# Patient Record
Sex: Female | Born: 1955 | Race: White | Hispanic: No | Marital: Married | Smoking: Never smoker
Health system: Southern US, Community
[De-identification: ages and names within clinical notes are randomized; demographics above are authoritative.]

## PROBLEM LIST (undated history)

## (undated) DIAGNOSIS — Z853 Personal history of malignant neoplasm of breast: Secondary | ICD-10-CM

## (undated) DIAGNOSIS — T8859XA Other complications of anesthesia, initial encounter: Secondary | ICD-10-CM

## (undated) DIAGNOSIS — N950 Postmenopausal bleeding: Secondary | ICD-10-CM

## (undated) DIAGNOSIS — T4145XA Adverse effect of unspecified anesthetic, initial encounter: Secondary | ICD-10-CM

## (undated) DIAGNOSIS — Z9889 Other specified postprocedural states: Secondary | ICD-10-CM

## (undated) DIAGNOSIS — Z973 Presence of spectacles and contact lenses: Secondary | ICD-10-CM

## (undated) DIAGNOSIS — R112 Nausea with vomiting, unspecified: Secondary | ICD-10-CM

## (undated) DIAGNOSIS — Z923 Personal history of irradiation: Secondary | ICD-10-CM

## (undated) HISTORY — PX: HYSTEROSCOPY WITH D & C: SHX1775

---

## 2002-05-05 ENCOUNTER — Other Ambulatory Visit: Admission: RE | Admit: 2002-05-05 | Discharge: 2002-05-05 | Payer: Self-pay | Admitting: Gynecology

## 2003-06-19 ENCOUNTER — Other Ambulatory Visit: Admission: RE | Admit: 2003-06-19 | Discharge: 2003-06-19 | Payer: Self-pay | Admitting: Gynecology

## 2008-09-25 ENCOUNTER — Encounter: Admission: RE | Admit: 2008-09-25 | Discharge: 2008-09-25 | Payer: Self-pay | Admitting: Gynecology

## 2008-09-25 ENCOUNTER — Encounter (INDEPENDENT_AMBULATORY_CARE_PROVIDER_SITE_OTHER): Payer: Self-pay | Admitting: Gynecology

## 2008-10-10 HISTORY — PX: BREAST LUMPECTOMY W/ NEEDLE LOCALIZATION: SHX1266

## 2008-11-01 HISTORY — PX: RE-EXCISION OF BREAST LUMPECTOMY: SHX6048

## 2010-10-01 ENCOUNTER — Other Ambulatory Visit: Payer: Self-pay | Admitting: Gynecology

## 2011-10-20 ENCOUNTER — Other Ambulatory Visit: Payer: Self-pay | Admitting: Gynecology

## 2012-05-19 ENCOUNTER — Other Ambulatory Visit: Payer: Self-pay | Admitting: Gynecology

## 2012-06-30 ENCOUNTER — Other Ambulatory Visit: Payer: Self-pay | Admitting: Obstetrics and Gynecology

## 2013-02-16 ENCOUNTER — Ambulatory Visit (HOSPITAL_COMMUNITY): Admission: RE | Admit: 2013-02-16 | Payer: Self-pay | Source: Ambulatory Visit | Admitting: Obstetrics and Gynecology

## 2013-02-16 ENCOUNTER — Encounter (HOSPITAL_COMMUNITY): Admission: RE | Payer: Self-pay | Source: Ambulatory Visit

## 2013-02-16 SURGERY — DILATATION & CURETTAGE/HYSTEROSCOPY WITH TRUCLEAR
Anesthesia: Choice

## 2013-04-15 ENCOUNTER — Encounter (HOSPITAL_COMMUNITY): Payer: Self-pay | Admitting: *Deleted

## 2013-04-19 ENCOUNTER — Encounter (HOSPITAL_COMMUNITY): Payer: Self-pay | Admitting: Pharmacist

## 2013-04-28 NOTE — H&P (Addendum)
58 yo with endometrial polyps presents for surgical mngt  PMHx:  Breast ca PSHx:  Hysteroscopy/D&C, lumpectomy, SVD x 2 SHX:  Negative tobacco All:  Neg Meds:  Tamoxifen  AF, VSS gen - NAD Abd - soft, Nt CV - RRR Lungs - clear PV - uterus mobile NT, no adnexal mass  US - 12 & 14mm polypoid mass.  No adnexal mass  A/P:  Endometrial mass, polyps Hysteroscopy D&C with trueclear resection of endometrial mass R/b/a reviewed, questions answered, informed consent

## 2013-04-29 ENCOUNTER — Encounter (HOSPITAL_COMMUNITY): Payer: BC Managed Care – PPO | Admitting: Anesthesiology

## 2013-04-29 ENCOUNTER — Ambulatory Visit (HOSPITAL_COMMUNITY): Payer: BC Managed Care – PPO | Admitting: Anesthesiology

## 2013-04-29 ENCOUNTER — Encounter (HOSPITAL_COMMUNITY)
Admission: RE | Disposition: A | Discharge status: Home / Self Care | Payer: Self-pay | Source: Ambulatory Visit | Attending: Obstetrics and Gynecology

## 2013-04-29 ENCOUNTER — Encounter (HOSPITAL_COMMUNITY): Payer: Self-pay | Admitting: *Deleted

## 2013-04-29 ENCOUNTER — Ambulatory Visit (HOSPITAL_COMMUNITY)
Admission: RE | Admit: 2013-04-29 | Discharge: 2013-04-29 | Disposition: A | Discharge status: Home / Self Care | Payer: BC Managed Care – PPO | Source: Ambulatory Visit | Attending: Obstetrics and Gynecology | Admitting: Obstetrics and Gynecology

## 2013-04-29 DIAGNOSIS — Z853 Personal history of malignant neoplasm of breast: Secondary | ICD-10-CM | POA: Insufficient documentation

## 2013-04-29 DIAGNOSIS — N840 Polyp of corpus uteri: Secondary | ICD-10-CM | POA: Insufficient documentation

## 2013-04-29 HISTORY — PX: DILATATION & CURETTAGE/HYSTEROSCOPY WITH TRUECLEAR: SHX6353

## 2013-04-29 LAB — CBC
HCT: 38.4 % (ref 36.0–46.0)
Hemoglobin: 13.3 g/dL (ref 12.0–15.0)
MCH: 31 pg (ref 26.0–34.0)
MCHC: 34.6 g/dL (ref 30.0–36.0)
MCV: 89.5 fL (ref 78.0–100.0)
PLATELETS: 195 10*3/uL (ref 150–400)
RBC: 4.29 MIL/uL (ref 3.87–5.11)
RDW: 13.1 % (ref 11.5–15.5)
WBC: 5.7 10*3/uL (ref 4.0–10.5)

## 2013-04-29 SURGERY — DILATATION & CURETTAGE/HYSTEROSCOPY WITH TRUCLEAR
Anesthesia: General | Site: Uterus

## 2013-04-29 MED ORDER — PROPOFOL 10 MG/ML IV EMUL
INTRAVENOUS | Status: AC
  Filled 2013-04-29: qty 20

## 2013-04-29 MED ORDER — FENTANYL CITRATE 0.05 MG/ML IJ SOLN: 25.0000 ug | INTRAMUSCULAR | Status: DC | PRN

## 2013-04-29 MED ORDER — LACTATED RINGERS IV SOLN
INTRAVENOUS | Status: DC
  Administered 2013-04-29 (×2): via INTRAVENOUS

## 2013-04-29 MED ORDER — ONDANSETRON HCL 4 MG/2ML IJ SOLN: 4.0000 mg | Freq: Once | INTRAMUSCULAR | Status: DC | PRN

## 2013-04-29 MED ORDER — ONDANSETRON HCL 4 MG/2ML IJ SOLN
INTRAMUSCULAR | Status: AC
  Filled 2013-04-29: qty 2

## 2013-04-29 MED ORDER — FENTANYL CITRATE 0.05 MG/ML IJ SOLN
INTRAMUSCULAR | Status: DC | PRN
  Administered 2013-04-29: 100 ug via INTRAVENOUS

## 2013-04-29 MED ORDER — ACETAMINOPHEN 160 MG/5ML PO SOLN: 325.0000 mg | ORAL | Status: DC | PRN

## 2013-04-29 MED ORDER — PROPOFOL 10 MG/ML IV BOLUS
INTRAVENOUS | Status: DC | PRN
  Administered 2013-04-29: 180 mg via INTRAVENOUS

## 2013-04-29 MED ORDER — ACETAMINOPHEN 325 MG PO TABS: 325.0000 mg | ORAL_TABLET | ORAL | Status: DC | PRN

## 2013-04-29 MED ORDER — IBUPROFEN 600 MG PO TABS: 600.0000 mg | ORAL_TABLET | Freq: Four times a day (QID) | ORAL | Status: DC | PRN

## 2013-04-29 MED ORDER — LIDOCAINE 1%/NA BICARB 0.1 MEQ INJECTION
INJECTION | INTRAVENOUS | Status: AC
  Filled 2013-04-29: qty 3

## 2013-04-29 MED ORDER — FENTANYL CITRATE 0.05 MG/ML IJ SOLN
INTRAMUSCULAR | Status: AC
  Filled 2013-04-29: qty 5

## 2013-04-29 MED ORDER — OXYCODONE-ACETAMINOPHEN 5-325 MG PO TABS: 1.0000 | ORAL_TABLET | ORAL | Status: DC | PRN

## 2013-04-29 MED ORDER — DEXAMETHASONE SODIUM PHOSPHATE 10 MG/ML IJ SOLN
INTRAMUSCULAR | Status: AC
  Filled 2013-04-29: qty 1

## 2013-04-29 MED ORDER — MIDAZOLAM HCL 2 MG/2ML IJ SOLN
INTRAMUSCULAR | Status: DC | PRN
  Administered 2013-04-29: 2 mg via INTRAVENOUS

## 2013-04-29 MED ORDER — LIDOCAINE HCL 1 % IJ SOLN
INTRAMUSCULAR | Status: DC | PRN
  Administered 2013-04-29: 10 mL

## 2013-04-29 MED ORDER — DEXAMETHASONE SODIUM PHOSPHATE 10 MG/ML IJ SOLN
INTRAMUSCULAR | Status: DC | PRN
  Administered 2013-04-29: 10 mg via INTRAVENOUS

## 2013-04-29 MED ORDER — EPHEDRINE 5 MG/ML INJ
INTRAVENOUS | Status: AC
  Filled 2013-04-29: qty 10

## 2013-04-29 MED ORDER — KETOROLAC TROMETHAMINE 30 MG/ML IJ SOLN
INTRAMUSCULAR | Status: DC | PRN
  Administered 2013-04-29: 30 mg via INTRAVENOUS

## 2013-04-29 MED ORDER — KETOROLAC TROMETHAMINE 30 MG/ML IJ SOLN: 15.0000 mg | Freq: Once | INTRAMUSCULAR | Status: DC | PRN

## 2013-04-29 MED ORDER — LIDOCAINE HCL 1 % IJ SOLN
INTRAMUSCULAR | Status: AC
  Filled 2013-04-29: qty 20

## 2013-04-29 MED ORDER — LIDOCAINE HCL (CARDIAC) 20 MG/ML IV SOLN
INTRAVENOUS | Status: DC | PRN
  Administered 2013-04-29: 50 mg via INTRAVENOUS

## 2013-04-29 MED ORDER — KETOROLAC TROMETHAMINE 30 MG/ML IJ SOLN
INTRAMUSCULAR | Status: AC
  Filled 2013-04-29: qty 1

## 2013-04-29 MED ORDER — EPHEDRINE SULFATE 50 MG/ML IJ SOLN
INTRAMUSCULAR | Status: DC | PRN
  Administered 2013-04-29 (×2): 10 mg via INTRAVENOUS

## 2013-04-29 MED ORDER — DEXTROSE 5 % IV SOLN
2.0000 g | INTRAVENOUS | Status: AC
  Administered 2013-04-29: 2 g via INTRAVENOUS
  Filled 2013-04-29: qty 2

## 2013-04-29 MED ORDER — LIDOCAINE HCL (CARDIAC) 20 MG/ML IV SOLN
INTRAVENOUS | Status: AC
  Filled 2013-04-29: qty 5

## 2013-04-29 MED ORDER — SODIUM CHLORIDE 0.9 % IR SOLN
Status: DC | PRN
  Administered 2013-04-29: 3000 mL

## 2013-04-29 MED ORDER — MIDAZOLAM HCL 2 MG/2ML IJ SOLN
INTRAMUSCULAR | Status: AC
  Filled 2013-04-29: qty 2

## 2013-04-29 MED ORDER — ONDANSETRON HCL 4 MG/2ML IJ SOLN
INTRAMUSCULAR | Status: DC | PRN
  Administered 2013-04-29: 4 mg via INTRAVENOUS

## 2013-04-29 SURGICAL SUPPLY — 19 items
BLADE INCISOR TRUC PLUS 2.9 (ABLATOR) ×1 IMPLANT
CANISTERS HI-FLOW 3000CC (CANNISTER) ×8 IMPLANT
CATH ROBINSON RED A/P 16FR (CATHETERS) ×2 IMPLANT
CLOTH BEACON ORANGE TIMEOUT ST (SAFETY) ×2 IMPLANT
DRAPE HYSTEROSCOPY (DRAPE) ×2 IMPLANT
DRSG TELFA 3X8 NADH (GAUZE/BANDAGES/DRESSINGS) ×2 IMPLANT
GLOVE BIO SURGEON STRL SZ 6.5 (GLOVE) ×2 IMPLANT
GLOVE BIOGEL PI IND STRL 7.0 (GLOVE) ×6 IMPLANT
GLOVE BIOGEL PI INDICATOR 7.0 (GLOVE) ×6
GLOVE SURG SS PI 7.0 STRL IVOR (GLOVE) ×8 IMPLANT
GOWN STRL REUS W/TWL LRG LVL3 (GOWN DISPOSABLE) ×4 IMPLANT
INCISOR TRUC PLUS BLADE 2.9 (ABLATOR) ×2
KIT HYSTEROSCOPY TRUCLEAR (ABLATOR) ×2 IMPLANT
NEEDLE SPNL 22GX3.5 QUINCKE BK (NEEDLE) ×2 IMPLANT
PACK VAGINAL MINOR WOMEN LF (CUSTOM PROCEDURE TRAY) ×2 IMPLANT
PAD OB MATERNITY 4.3X12.25 (PERSONAL CARE ITEMS) ×2 IMPLANT
SYR CONTROL 10ML LL (SYRINGE) ×2 IMPLANT
TOWEL OR 17X24 6PK STRL BLUE (TOWEL DISPOSABLE) ×4 IMPLANT
WATER STERILE IRR 1000ML POUR (IV SOLUTION) ×2 IMPLANT

## 2013-04-29 NOTE — Discharge Instructions (Signed)
[facility] office 2-3 weeks for postop appointment.  Call the office [phone] for an appointment. ° °Personal Hygiene: °Use pads not tampons x 1week °You may shower, no tub baths or pools for 2-3 weeks °Wipe from front to back when using restroom ° °Activity: °Do not drive or operate any equipment for 24 hrs.   °Do not rest in bed all day °Walking is encouraged °Walk up and down stairs slowly °You may return to your normal activity in 1-2 days ° °Sexual Activity:  No intercourse for 2 weeks after the procedure. ° °Diet: Eat a light meal as desired this evening.  You may resume your usual diet tomorrow. ° °Return to work:  You may resume your work activities after 1-2 days ° °What to expect:  Expect to have vaginal bleeding/discharge for 2-3 days and spotting for 10-14 days.  It is not unusual to have soreness for 1-2 weeks.  You may have a slight burning sensation when you urinate for the first few days.  You may start your menses in 2-6 weeks.  Mild cramps may continue for a couple of days.   ° °Call your doctor:   °Excessive bleeding, saturating a pad every hour °Inability to urinate 6 hours after discharge °Pain not relieved with pain medications °Fever of 100.4 or greater ° °

## 2013-04-29 NOTE — Anesthesia Preprocedure Evaluation (Signed)
Anesthesia Evaluation  Patient identified by MRN, date of birth, ID band Patient awake    Reviewed: Allergy & Precautions, H&P , NPO status , Patient's Chart, lab work & pertinent test results  History of Anesthesia Complications Negative for: history of anesthetic complications  Airway Mallampati: II TM Distance: >3 FB Neck ROM: Full    Dental  (+) Teeth Intact   Pulmonary neg pulmonary ROS,    Pulmonary exam normal       Cardiovascular negative cardio ROS  Rhythm:Regular Rate:Normal     Neuro/Psych negative neurological ROS     GI/Hepatic negative GI ROS, Neg liver ROS,   Endo/Other  negative endocrine ROS  Renal/GU negative Renal ROS  negative genitourinary   Musculoskeletal negative musculoskeletal ROS (+)   Abdominal   Peds  Hematology negative hematology ROS (+)   Anesthesia Other Findings   Reproductive/Obstetrics                           Anesthesia Physical Anesthesia Plan  ASA: II  Anesthesia Plan: General   Post-op Pain Management:    Induction: Intravenous  Airway Management Planned: LMA  Additional Equipment: None  Intra-op Plan:   Post-operative Plan: Extubation in OR  Informed Consent: I have reviewed the patients History and Physical, chart, labs and discussed the procedure including the risks, benefits and alternatives for the proposed anesthesia with the patient or authorized representative who has indicated his/her understanding and acceptance.   Dental advisory given  Plan Discussed with: CRNA and Surgeon  Anesthesia Plan Comments:         Anesthesia Quick Evaluation

## 2013-04-29 NOTE — Transfer of Care (Signed)
Immediate Anesthesia Transfer of Care Note  Patient: Barbara GibbonsDonna A [name]  Procedure(s) Performed: Procedure(s): DILATATION & CURETTAGE/HYSTEROSCOPY WITH TRUCLEAR (N/A)  Patient Location: PACU  Anesthesia Type:General  Level of Consciousness: awake  Airway & Oxygen Therapy: Patient Spontanous Breathing  Post-op Assessment: Report given to PACU RN  Post vital signs: stable  Filed Vitals:   04/29/13 1256  BP: 121/58  Pulse: 74  Temp: 36.8 C  Resp: 18    Complications: No apparent anesthesia complications

## 2013-04-29 NOTE — Anesthesia Postprocedure Evaluation (Signed)
  Anesthesia Post-op Note  Patient: Barbara GibbonsDonna A [name]  Procedure(s) Performed: Procedure(s): DILATATION & CURETTAGE/HYSTEROSCOPY WITH TRUCLEAR (N/A)  Patient Location: PACU  Anesthesia Type:General  Level of Consciousness: awake, alert  and oriented  Airway and Oxygen Therapy: Patient Spontanous Breathing  Post-op Pain: mild  Post-op Assessment: Post-op Vital signs reviewed, Patient's Cardiovascular Status Stable, Respiratory Function Stable, Patent Airway, No signs of Nausea or vomiting and Pain level controlled  Post-op Vital Signs: Reviewed and stable  Complications: No apparent anesthesia complications

## 2013-04-29 NOTE — Op Note (Signed)
Barbara Mo:  [name], Juda                 ACCOUNT NO.:  [id number]  MEDICAL RECORD NO.:  [id number]  LOCATION:  [facility]                          FACILITY:  [facility]  PHYSICIAN:  Zelphia CairoGretchen Weiland Tomich, MD    DATE OF BIRTH:  Nov 25, 1955  DATE OF PROCEDURE: DATE OF DISCHARGE:  04/29/2013                              OPERATIVE REPORT   PREOPERATIVE DIAGNOSIS:  Endometrial polyps.  POSTOPERATIVE DIAGNOSIS:  Endometrial polyps, path pending.  PROCEDURE: 1. Cervical block. 2. Hysteroscopy. 3. D and C. 4. True clear resection of endometrial polyp.  SURGEON:  Zelphia CairoGretchen Anijah Spohr, MD  ANESTHESIA:  General.  FLUID DEFICIT:  100 mL.  BLOOD LOSS:  Minimal.  COMPLICATIONS:  None.  CONDITION:  Stable to recovery room.  PROCEDURE:  The patient was taken to the operating room.  After informed consent was obtained, she was given general anesthesia and placed in the dorsal lithotomy position using Allen stirrups.  She was prepped and draped in sterile fashion and in and out catheter was used to drain her bladder for an unmeasured amount of urine bivalve.  Speculum was placed in the vagina and 1 mL of 1% Xylocaine was injected at the 12 o'clock position of the cervix.  Single-tooth tenaculum was attached to the anterior lip of the cervix.  The remaining 9 mL was used to perform a cervical block.  The cervix was then serially dilated using Pratt dilators.  Hysteroscope was inserted into the cavity and multiple polypoid masses were identified.  True clear device was used to resect the polypoid masses to the base.  Specimens were passed off to be sent to pathology.  No other masses abnormalities were identified, bilateral ostia were visualized and appeared normal.  Instruments were removed from the uterus.  The tenaculum was removed from the cervix.  The cervix was hemostatic.  Speculum was removed.  She was extubated and taken to the recovery room in stable condition.  Sponge, lap, needle, and instrument counts  were correct x2.     Zelphia CairoGretchen Frimet Durfee, MD     GA/MEDQ  D:  04/29/2013  T:  04/29/2013  Job:  [id number]

## 2013-05-02 ENCOUNTER — Encounter (HOSPITAL_COMMUNITY): Payer: Self-pay | Admitting: Obstetrics and Gynecology

## 2014-01-11 ENCOUNTER — Other Ambulatory Visit: Payer: Self-pay | Admitting: Gynecology

## 2014-01-13 LAB — CYTOLOGY - PAP

## 2014-01-17 ENCOUNTER — Other Ambulatory Visit: Payer: Self-pay | Admitting: Gynecology

## 2014-05-11 ENCOUNTER — Encounter (HOSPITAL_BASED_OUTPATIENT_CLINIC_OR_DEPARTMENT_OTHER): Payer: Self-pay | Admitting: *Deleted

## 2014-05-12 ENCOUNTER — Encounter (HOSPITAL_BASED_OUTPATIENT_CLINIC_OR_DEPARTMENT_OTHER): Payer: Self-pay | Admitting: *Deleted

## 2014-05-12 NOTE — Progress Notes (Signed)
NPO AFTER MN. ARRIVE AT 0600.  NEEDS HG. PRE-OP PENDING.

## 2014-05-15 NOTE — H&P (Addendum)
Barbara GibbonsDonna A [name] is an 59 y.o. female with recurrent endometrial polyps presents for surgical resection.  Pt with h/o breast cancer on tamoxifen.      No LMP recorded. Patient is postmenopausal.    Past Medical History  Diagnosis Date  . Endometrial polyp   . 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 History of radiation therapy     10/ 2010--  02-21-2009--  right breast  . Complication of anesthesia     hard to wake  . PONV (postoperative nausea and vomiting)   . Wears contact lenses     Past Surgical History  Procedure Laterality Date  . Dilatation & curettage/hysteroscopy with trueclear N/A 04/29/2013    Procedure: DILATATION & CURETTAGE/HYSTEROSCOPY WITH TRUCLEAR;  Surgeon: Zelphia CairoGretchen Helio Lack, MD;  Location: [facility];  Service: Gynecology;  Laterality: N/A;  . Hysteroscopy w/d&c  aug 2012  &  april 2014  . Breast lumpectomy w/ needle localization Right 10-10-2008  . Re-excision of breast lumpectomy  sept 2010    History reviewed. No pertinent family history.  Social History:  reports that she has never smoked. She has never used smokeless tobacco. She reports that she drinks alcohol. She reports that she does not use illicit drugs.  Allergies:  Allergies  Allergen Reactions  . 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 Physical Exam  Gen - NAD Abd - soft, NT/ND CV - RRR Lungs - clear Ext - NT, no edema PV - uterus mobile, NT  US - 2.693mm & 9mm endometrial mass noted,  No adnexal masses   Assessment/Plan:  Endometrial polyps Hysteroscopy, D&C, resection of endometrial polyps R/b/a discussed, questions answered, informed  consent  Isaiah Torok 05/15/2014, 1:32 PM

## 2014-05-16 ENCOUNTER — Ambulatory Visit (HOSPITAL_BASED_OUTPATIENT_CLINIC_OR_DEPARTMENT_OTHER): Payer: BC Managed Care – PPO | Admitting: Anesthesiology

## 2014-05-16 ENCOUNTER — Encounter (HOSPITAL_BASED_OUTPATIENT_CLINIC_OR_DEPARTMENT_OTHER): Payer: Self-pay

## 2014-05-16 ENCOUNTER — Encounter (HOSPITAL_BASED_OUTPATIENT_CLINIC_OR_DEPARTMENT_OTHER)
Admission: RE | Disposition: A | Discharge status: Home / Self Care | Payer: Self-pay | Source: Ambulatory Visit | Attending: Obstetrics and Gynecology

## 2014-05-16 ENCOUNTER — Ambulatory Visit (HOSPITAL_BASED_OUTPATIENT_CLINIC_OR_DEPARTMENT_OTHER)
Admission: RE | Admit: 2014-05-16 | Discharge: 2014-05-16 | Disposition: A | Discharge status: Home / Self Care | Payer: BC Managed Care – PPO | Source: Ambulatory Visit | Attending: Obstetrics and Gynecology | Admitting: Obstetrics and Gynecology

## 2014-05-16 DIAGNOSIS — N840 Polyp of corpus uteri: Secondary | ICD-10-CM | POA: Diagnosis not present

## 2014-05-16 DIAGNOSIS — Z853 Personal history of malignant neoplasm of breast: Secondary | ICD-10-CM | POA: Diagnosis not present

## 2014-05-16 DIAGNOSIS — Z888 Allergy status to other drugs, medicaments and biological substances status: Secondary | ICD-10-CM | POA: Insufficient documentation

## 2014-05-16 DIAGNOSIS — N856 Intrauterine synechiae: Secondary | ICD-10-CM | POA: Insufficient documentation

## 2014-05-16 DIAGNOSIS — Z9011 Acquired absence of right breast and nipple: Secondary | ICD-10-CM | POA: Diagnosis not present

## 2014-05-16 HISTORY — DX: Other specified postprocedural states: R11.2

## 2014-05-16 HISTORY — PX: DILATATION & CURETTAGE/HYSTEROSCOPY WITH MYOSURE: SHX6511

## 2014-05-16 HISTORY — DX: Personal history of malignant neoplasm of breast: Z85.3

## 2014-05-16 HISTORY — DX: Personal history of irradiation: Z92.3

## 2014-05-16 HISTORY — DX: Other complications of anesthesia, initial encounter: T88.59XA

## 2014-05-16 HISTORY — DX: Other specified postprocedural states: Z98.890

## 2014-05-16 HISTORY — DX: Presence of spectacles and contact lenses: Z97.3

## 2014-05-16 HISTORY — DX: Adverse effect of unspecified anesthetic, initial encounter: T41.45XA

## 2014-05-16 LAB — CBC
HCT: 38.4 % (ref 36.0–46.0)
HEMOGLOBIN: 13 g/dL (ref 12.0–15.0)
MCH: 30.8 pg (ref 26.0–34.0)
MCHC: 33.9 g/dL (ref 30.0–36.0)
MCV: 91 fL (ref 78.0–100.0)
Platelets: 208 10*3/uL (ref 150–400)
RBC: 4.22 MIL/uL (ref 3.87–5.11)
RDW: 13.4 % (ref 11.5–15.5)
WBC: 6 10*3/uL (ref 4.0–10.5)

## 2014-05-16 SURGERY — DILATATION & CURETTAGE/HYSTEROSCOPY WITH MYOSURE
Anesthesia: General | Site: Vagina

## 2014-05-16 MED ORDER — ONDANSETRON HCL 4 MG/2ML IJ SOLN
INTRAMUSCULAR | Status: DC | PRN
  Administered 2014-05-16: 4 mg via INTRAVENOUS

## 2014-05-16 MED ORDER — ACETAMINOPHEN 10 MG/ML IV SOLN
INTRAVENOUS | Status: DC | PRN
  Administered 2014-05-16: 1000 mg via INTRAVENOUS

## 2014-05-16 MED ORDER — LACTATED RINGERS IV SOLN
INTRAVENOUS | Status: DC
  Administered 2014-05-16: 07:00:00 via INTRAVENOUS
  Filled 2014-05-16: qty 1000

## 2014-05-16 MED ORDER — FENTANYL CITRATE 0.05 MG/ML IJ SOLN
INTRAMUSCULAR | Status: DC | PRN
Start: 2014-05-16 — End: 2014-05-16
  Administered 2014-05-16: 12.5 ug via INTRAVENOUS
  Administered 2014-05-16: 25 ug via INTRAVENOUS
  Administered 2014-05-16: 12.5 ug via INTRAVENOUS
  Administered 2014-05-16 (×2): 25 ug via INTRAVENOUS

## 2014-05-16 MED ORDER — FENTANYL CITRATE 0.05 MG/ML IJ SOLN
25.0000 ug | INTRAMUSCULAR | Status: DC | PRN
  Filled 2014-05-16: qty 1

## 2014-05-16 MED ORDER — LACTATED RINGERS IV SOLN
INTRAVENOUS | Status: DC | PRN
  Administered 2014-05-16 (×2): via INTRAVENOUS

## 2014-05-16 MED ORDER — DEXAMETHASONE SODIUM PHOSPHATE 10 MG/ML IJ SOLN
INTRAMUSCULAR | Status: DC | PRN
  Administered 2014-05-16: 10 mg via INTRAVENOUS

## 2014-05-16 MED ORDER — MIDAZOLAM HCL 2 MG/2ML IJ SOLN
INTRAMUSCULAR | Status: AC
  Filled 2014-05-16: qty 4

## 2014-05-16 MED ORDER — LIDOCAINE HCL (CARDIAC) 20 MG/ML IV SOLN
INTRAVENOUS | Status: DC | PRN
  Administered 2014-05-16: 100 mg via INTRAVENOUS

## 2014-05-16 MED ORDER — MIDAZOLAM HCL 5 MG/5ML IJ SOLN
INTRAMUSCULAR | Status: DC | PRN
Start: 2014-05-16 — End: 2014-05-16
  Administered 2014-05-16: 2 mg via INTRAVENOUS

## 2014-05-16 MED ORDER — KETOROLAC TROMETHAMINE 30 MG/ML IJ SOLN
30.0000 mg | Freq: Once | INTRAMUSCULAR | Status: DC | PRN
  Filled 2014-05-16: qty 1

## 2014-05-16 MED ORDER — HYDROCODONE-IBUPROFEN 7.5-200 MG PO TABS: 1.0000 | ORAL_TABLET | Freq: Three times a day (TID) | ORAL | Status: DC | PRN

## 2014-05-16 MED ORDER — LIDOCAINE HCL 1 % IJ SOLN
INTRAMUSCULAR | Status: DC | PRN
  Administered 2014-05-16: 20 mL

## 2014-05-16 MED ORDER — SODIUM CHLORIDE 0.9 % IR SOLN
Status: DC | PRN
  Administered 2014-05-16: 3000 mL

## 2014-05-16 MED ORDER — PROMETHAZINE HCL 25 MG/ML IJ SOLN
6.2500 mg | INTRAMUSCULAR | Status: DC | PRN
  Filled 2014-05-16: qty 1

## 2014-05-16 MED ORDER — KETOROLAC TROMETHAMINE 30 MG/ML IJ SOLN
INTRAMUSCULAR | Status: DC | PRN
  Administered 2014-05-16: 30 mg via INTRAVENOUS

## 2014-05-16 MED ORDER — FENTANYL CITRATE 0.05 MG/ML IJ SOLN
INTRAMUSCULAR | Status: AC
  Filled 2014-05-16: qty 4

## 2014-05-16 MED ORDER — GLYCOPYRROLATE 0.2 MG/ML IJ SOLN
INTRAMUSCULAR | Status: DC | PRN
  Administered 2014-05-16: 0.2 mg via INTRAVENOUS

## 2014-05-16 MED ORDER — PROPOFOL 10 MG/ML IV BOLUS
INTRAVENOUS | Status: DC | PRN
  Administered 2014-05-16: 200 mg via INTRAVENOUS

## 2014-05-16 SURGICAL SUPPLY — 28 items
CANISTER SUCTION 2500CC (MISCELLANEOUS) ×4 IMPLANT
CATH ROBINSON RED A/P 16FR (CATHETERS) ×2 IMPLANT
COVER TABLE BACK 60X90 (DRAPES) ×2 IMPLANT
DEVICE MYOSURE CLASSIC (MISCELLANEOUS) ×2 IMPLANT
DRAPE HYSTEROSCOPY (DRAPE) ×2 IMPLANT
DRAPE LG THREE QUARTER DISP (DRAPES) ×2 IMPLANT
DRSG TELFA 3X8 NADH (GAUZE/BANDAGES/DRESSINGS) ×2 IMPLANT
GLOVE BIO SURGEON STRL SZ 6.5 (GLOVE) ×6 IMPLANT
GLOVE BIOGEL PI IND STRL 6.5 (GLOVE) ×1 IMPLANT
GLOVE BIOGEL PI IND STRL 7.0 (GLOVE) ×1 IMPLANT
GLOVE BIOGEL PI IND STRL 7.5 (GLOVE) ×1 IMPLANT
GLOVE BIOGEL PI INDICATOR 6.5 (GLOVE) ×1
GLOVE BIOGEL PI INDICATOR 7.0 (GLOVE) ×1
GLOVE BIOGEL PI INDICATOR 7.5 (GLOVE) ×1
GOWN STRL REUS W/ TWL LRG LVL3 (GOWN DISPOSABLE) ×2 IMPLANT
GOWN STRL REUS W/TWL LRG LVL3 (GOWN DISPOSABLE) ×2
IV NS IRRIG 3000ML ARTHROMATIC (IV SOLUTION) ×2 IMPLANT
LEGGING LITHOTOMY PAIR STRL (DRAPES) ×2 IMPLANT
NEEDLE SPNL 22GX3.5 QUINCKE BK (NEEDLE) ×2 IMPLANT
PACK BASIN DAY SURGERY FS (CUSTOM PROCEDURE TRAY) ×2 IMPLANT
PAD OB MATERNITY 4.3X12.25 (PERSONAL CARE ITEMS) ×2 IMPLANT
PAD PREP 24X48 CUFFED NSTRL (MISCELLANEOUS) ×2 IMPLANT
SEAL ROD LENS SCOPE MYOSURE (ABLATOR) ×2 IMPLANT
SYR CONTROL 10ML LL (SYRINGE) ×2 IMPLANT
TOWEL OR 17X24 6PK STRL BLUE (TOWEL DISPOSABLE) ×4 IMPLANT
TRAY DSU PREP LF (CUSTOM PROCEDURE TRAY) ×2 IMPLANT
TUBING AQUILEX INFLOW (TUBING) ×2 IMPLANT
TUBING AQUILEX OUTFLOW (TUBING) ×2 IMPLANT

## 2014-05-16 NOTE — Op Note (Signed)
[name]:  [name], Barbara                 ACCOUNT NO.:  [id number]  MEDICAL RECORD NO.:  [id number]  LOCATION:                                 FACILITY:  PHYSICIAN:  Zelphia CairoGretchen Dimitrios Balestrieri, MD    DATE OF BIRTH:  [birth year]  DATE OF PROCEDURE:  05/16/2014 DATE OF DISCHARGE:  05/16/2014                              OPERATIVE REPORT   PREOPERATIVE DIAGNOSIS:  Endometrial polyps.  POSTOPERATIVE DIAGNOSIS:  Endometrial polyps.  PROCEDURES: 1. Paracervical block. 2. Hysteroscopy D and C. 3. MyoSure resection of endometrial polyps.  SURGEON:  Zelphia CairoGretchen Riggins Cisek, MD.  ANESTHESIA:  General with local.  COMPLICATIONS:  None.  SPECIMEN:  Endometrial curettings and polyps.  CONDITION:  Extubated and stable to recovery room.  PROCEDURE IN DETAIL:  The patient was taken to the operating room after informed consent was obtained.  She was given general anesthesia and placed in the dorsal lithotomy position using Allen stirrups.  She was prepped and draped in sterile fashion.  An in-and-out catheter was used to drain her bladder.  Bivalve speculum was placed in the vagina, and 1% lidocaine was injected at the 12 o'clock position of the cervix.  Single- tooth tenaculum was attached, and the remaining 1% lidocaine was used to perform a paracervical block.  The cervix was then dilated using Pratt dilators and the hysteroscope was inserted.  Good visualization was noted.  Polypoid masses were noted throughout the endometrial cavity. She also appeared to have some synechiae along the right uterine wall. The MyoSure was then inserted, and the endometrial cavity was cleaned up using this resection device.  Endometrial polyp was resected to the base.  Synechiae were excised.  Bilateral ostia were visualized and appeared normal.  A gentle curetting was then performed and placed on Telfa.  All instruments and hysteroscope were then removed from the uterus.  Single-tooth tenaculum was removed from the cervix, and  the cervix was hemostatic.  Speculum was removed.  She was extubated and taken to the recovery room in stable condition.     Zelphia CairoGretchen Shayana Hornstein, MD     GA/MEDQ  D:  05/16/2014  T:  05/16/2014  Job:  [id number]

## 2014-05-16 NOTE — Discharge Instructions (Signed)

## 2014-05-16 NOTE — Anesthesia Postprocedure Evaluation (Signed)
  Anesthesia Post-op Note  Patient: Barbara [name]  Procedure(s) Performed: Procedure(s) (LRB): DILATATION & CURETTAGE/HYSTEROSCOPY WITH MYOSURE (N/A)  Patient Location: PACU  Anesthesia Type: General  Level of Consciousness: awake and alert   Airway and Oxygen Therapy: Patient Spontanous Breathing  Post-op Pain: mild  Post-op Assessment: Post-op Vital signs reviewed, Patient's Cardiovascular Status Stable, Respiratory Function Stable, Patent Airway and No signs of Nausea or vomiting  Last Vitals:  Filed Vitals:   05/16/14 0826  BP: 95/47  Pulse: 78  Temp:   Resp: 15    Post-op Vital Signs: stable   Complications: No apparent anesthesia complications

## 2014-05-16 NOTE — Transfer of Care (Signed)
Immediate Anesthesia Transfer of Care Note  Patient: Barbara GibbonsDonna A [name]  Procedure(s) Performed: Procedure(s) (LRB): DILATATION & CURETTAGE/HYSTEROSCOPY WITH MYOSURE (N/A)  Patient Location: PACU  Anesthesia Type: General  Level of Consciousness: awake, sedated, patient cooperative and responds to stimulation  Airway & Oxygen Therapy: Patient Spontanous Breathing and Patient connected to face mask oxygen  Post-op Assessment: Report given to PACU RN, Post -op Vital signs reviewed and stable and Patient moving all extremities  Post vital signs: Reviewed and stable  Complications: No apparent anesthesia complications

## 2014-05-16 NOTE — Anesthesia Preprocedure Evaluation (Addendum)
Anesthesia Evaluation  Patient identified by MRN, date of birth, ID band Patient awake    Reviewed: Allergy & Precautions, NPO status , Patient's Chart, lab work & pertinent test results  Airway Mallampati: II  TM Distance: >3 FB Neck ROM: Full    Dental no notable dental hx.    Pulmonary neg pulmonary ROS,  breath sounds clear to auscultation  Pulmonary exam normal       Cardiovascular negative cardio ROS  Rhythm:Regular Rate:Normal     Neuro/Psych negative neurological ROS  negative psych ROS   GI/Hepatic negative GI ROS, Neg liver ROS,   Endo/Other  negative endocrine ROS  Renal/GU negative Renal ROS  negative genitourinary   Musculoskeletal negative musculoskeletal ROS (+)   Abdominal   Peds negative pediatric ROS (+)  Hematology negative hematology ROS (+)   Anesthesia Other Findings   Reproductive/Obstetrics negative OB ROS                             Anesthesia Physical Anesthesia Plan  ASA: I  Anesthesia Plan: General   Post-op Pain Management:    Induction: Intravenous  Airway Management Planned: LMA  Additional Equipment:   Intra-op Plan:   Post-operative Plan:   Informed Consent: I have reviewed the patients History and Physical, chart, labs and discussed the procedure including the risks, benefits and alternatives for the proposed anesthesia with the patient or authorized representative who has indicated his/her understanding and acceptance.   Dental advisory given  Plan Discussed with: CRNA and Surgeon  Anesthesia Plan Comments:         Anesthesia Quick Evaluation  

## 2014-05-17 ENCOUNTER — Encounter (HOSPITAL_BASED_OUTPATIENT_CLINIC_OR_DEPARTMENT_OTHER): Payer: Self-pay | Admitting: Obstetrics and Gynecology

## 2015-02-16 ENCOUNTER — Encounter (HOSPITAL_BASED_OUTPATIENT_CLINIC_OR_DEPARTMENT_OTHER): Payer: Self-pay | Admitting: *Deleted

## 2015-02-16 NOTE — Progress Notes (Signed)
NPO AFTER MN.  ARRIVE AT 0600.  NEEDS HG.  

## 2015-02-19 NOTE — Anesthesia Preprocedure Evaluation (Signed)
Anesthesia Evaluation  Patient identified by MRN, date of birth, ID band Patient awake    Reviewed: Allergy & Precautions, NPO status , Patient's Chart, lab work & pertinent test results  History of Anesthesia Complications (+) PONV and history of anesthetic complications  Airway Mallampati: II  TM Distance: >3 FB Neck ROM: Full    Dental no notable dental hx.    Pulmonary neg pulmonary ROS,    Pulmonary exam normal breath sounds clear to auscultation       Cardiovascular negative cardio ROS Normal cardiovascular exam Rhythm:Regular Rate:Normal     Neuro/Psych negative neurological ROS  negative psych ROS   GI/Hepatic negative GI ROS, Neg liver ROS,   Endo/Other  negative endocrine ROS  Renal/GU negative Renal ROS  negative genitourinary   Musculoskeletal negative musculoskeletal ROS (+)   Abdominal   Peds negative pediatric ROS (+)  Hematology negative hematology ROS (+)   Anesthesia Other Findings   Reproductive/Obstetrics negative OB ROS                             Anesthesia Physical Anesthesia Plan  ASA: I  Anesthesia Plan: General   Post-op Pain Management:    Induction: Intravenous  Airway Management Planned: LMA  Additional Equipment:   Intra-op Plan:   Post-operative Plan: Extubation in OR  Informed Consent: I have reviewed the patients History and Physical, chart, labs and discussed the procedure including the risks, benefits and alternatives for the proposed anesthesia with the patient or authorized representative who has indicated his/her understanding and acceptance.   Dental advisory given  Plan Discussed with: CRNA  Anesthesia Plan Comments:         Anesthesia Quick Evaluation

## 2015-02-20 ENCOUNTER — Ambulatory Visit (HOSPITAL_BASED_OUTPATIENT_CLINIC_OR_DEPARTMENT_OTHER): Payer: BC Managed Care – PPO | Admitting: Anesthesiology

## 2015-02-20 ENCOUNTER — Ambulatory Visit (HOSPITAL_BASED_OUTPATIENT_CLINIC_OR_DEPARTMENT_OTHER)
Admission: RE | Admit: 2015-02-20 | Discharge: 2015-02-20 | Disposition: A | Discharge status: Home / Self Care | Payer: BC Managed Care – PPO | Source: Ambulatory Visit | Attending: Obstetrics and Gynecology | Admitting: Obstetrics and Gynecology

## 2015-02-20 ENCOUNTER — Encounter (HOSPITAL_BASED_OUTPATIENT_CLINIC_OR_DEPARTMENT_OTHER)
Admission: RE | Disposition: A | Discharge status: Home / Self Care | Payer: Self-pay | Source: Ambulatory Visit | Attending: Obstetrics and Gynecology

## 2015-02-20 ENCOUNTER — Encounter (HOSPITAL_BASED_OUTPATIENT_CLINIC_OR_DEPARTMENT_OTHER): Payer: Self-pay | Admitting: *Deleted

## 2015-02-20 DIAGNOSIS — Z853 Personal history of malignant neoplasm of breast: Secondary | ICD-10-CM | POA: Insufficient documentation

## 2015-02-20 DIAGNOSIS — N840 Polyp of corpus uteri: Secondary | ICD-10-CM | POA: Diagnosis not present

## 2015-02-20 DIAGNOSIS — Z923 Personal history of irradiation: Secondary | ICD-10-CM | POA: Insufficient documentation

## 2015-02-20 DIAGNOSIS — N950 Postmenopausal bleeding: Secondary | ICD-10-CM | POA: Diagnosis present

## 2015-02-20 HISTORY — DX: Postmenopausal bleeding: N95.0

## 2015-02-20 HISTORY — PX: HYSTEROSCOPY WITH D & C: SHX1775

## 2015-02-20 LAB — POCT HEMOGLOBIN-HEMACUE: HEMOGLOBIN: 14.1 g/dL (ref 12.0–15.0)

## 2015-02-20 SURGERY — DILATATION AND CURETTAGE /HYSTEROSCOPY
Anesthesia: General | Site: Cervix

## 2015-02-20 MED ORDER — LIDOCAINE HCL 1 % IJ SOLN
INTRAMUSCULAR | Status: DC | PRN
  Administered 2015-02-20: 10 mL

## 2015-02-20 MED ORDER — FENTANYL CITRATE (PF) 100 MCG/2ML IJ SOLN
INTRAMUSCULAR | Status: AC
  Filled 2015-02-20: qty 2

## 2015-02-20 MED ORDER — SODIUM CHLORIDE 0.9 % IR SOLN
Status: DC | PRN
  Administered 2015-02-20: 3000 mL

## 2015-02-20 MED ORDER — DEXAMETHASONE SODIUM PHOSPHATE 10 MG/ML IJ SOLN
INTRAMUSCULAR | Status: AC
  Filled 2015-02-20: qty 1

## 2015-02-20 MED ORDER — PROPOFOL 10 MG/ML IV BOLUS
INTRAVENOUS | Status: DC | PRN
  Administered 2015-02-20: 150 mg via INTRAVENOUS

## 2015-02-20 MED ORDER — ONDANSETRON HCL 4 MG/2ML IJ SOLN
INTRAMUSCULAR | Status: AC
  Filled 2015-02-20: qty 2

## 2015-02-20 MED ORDER — LIDOCAINE HCL (CARDIAC) 20 MG/ML IV SOLN
INTRAVENOUS | Status: DC | PRN
  Administered 2015-02-20: 60 mg via INTRAVENOUS

## 2015-02-20 MED ORDER — FENTANYL CITRATE (PF) 100 MCG/2ML IJ SOLN
25.0000 ug | INTRAMUSCULAR | Status: DC | PRN
  Filled 2015-02-20: qty 1

## 2015-02-20 MED ORDER — PROPOFOL 10 MG/ML IV BOLUS
INTRAVENOUS | Status: AC
  Filled 2015-02-20: qty 20

## 2015-02-20 MED ORDER — PROMETHAZINE HCL 25 MG/ML IJ SOLN
6.2500 mg | INTRAMUSCULAR | Status: DC | PRN
  Filled 2015-02-20: qty 1

## 2015-02-20 MED ORDER — FENTANYL CITRATE (PF) 100 MCG/2ML IJ SOLN
INTRAMUSCULAR | Status: DC | PRN
  Administered 2015-02-20: 50 ug via INTRAVENOUS

## 2015-02-20 MED ORDER — KETOROLAC TROMETHAMINE 30 MG/ML IJ SOLN
INTRAMUSCULAR | Status: DC | PRN
  Administered 2015-02-20: 30 mg via INTRAVENOUS

## 2015-02-20 MED ORDER — HYDROCODONE-IBUPROFEN 7.5-200 MG PO TABS: 1.0000 | ORAL_TABLET | Freq: Three times a day (TID) | ORAL | Status: AC | PRN

## 2015-02-20 MED ORDER — ARTIFICIAL TEARS OP OINT
TOPICAL_OINTMENT | OPHTHALMIC | Status: AC
  Filled 2015-02-20: qty 3.5

## 2015-02-20 MED ORDER — CEFOTETAN DISODIUM-DEXTROSE 2-2.08 GM-% IV SOLR
INTRAVENOUS | Status: AC
  Filled 2015-02-20: qty 50

## 2015-02-20 MED ORDER — MIDAZOLAM HCL 5 MG/5ML IJ SOLN
INTRAMUSCULAR | Status: DC | PRN
  Administered 2015-02-20: 2 mg via INTRAVENOUS

## 2015-02-20 MED ORDER — ONDANSETRON HCL 4 MG/2ML IJ SOLN
INTRAMUSCULAR | Status: DC | PRN
  Administered 2015-02-20: 4 mg via INTRAVENOUS

## 2015-02-20 MED ORDER — DEXTROSE 5 % IV SOLN
2.0000 g | INTRAVENOUS | Status: AC
  Administered 2015-02-20: 2 g via INTRAVENOUS
  Filled 2015-02-20: qty 2

## 2015-02-20 MED ORDER — DEXAMETHASONE SODIUM PHOSPHATE 4 MG/ML IJ SOLN
INTRAMUSCULAR | Status: DC | PRN
  Administered 2015-02-20: 10 mg via INTRAVENOUS

## 2015-02-20 MED ORDER — LIDOCAINE HCL (CARDIAC) 20 MG/ML IV SOLN
INTRAVENOUS | Status: AC
  Filled 2015-02-20: qty 5

## 2015-02-20 MED ORDER — LIDOCAINE HCL 1 % IJ SOLN
INTRAMUSCULAR | Status: AC
  Filled 2015-02-20: qty 20

## 2015-02-20 MED ORDER — KETOROLAC TROMETHAMINE 30 MG/ML IJ SOLN
INTRAMUSCULAR | Status: AC
  Filled 2015-02-20: qty 1

## 2015-02-20 MED ORDER — MIDAZOLAM HCL 2 MG/2ML IJ SOLN
INTRAMUSCULAR | Status: AC
  Filled 2015-02-20: qty 2

## 2015-02-20 MED ORDER — LACTATED RINGERS IV SOLN
INTRAVENOUS | Status: DC
  Administered 2015-02-20 (×2): via INTRAVENOUS
  Filled 2015-02-20: qty 1000

## 2015-02-20 SURGICAL SUPPLY — 32 items
CANISTER SUCTION 2500CC (MISCELLANEOUS) ×2 IMPLANT
CATH ROBINSON RED A/P 16FR (CATHETERS) IMPLANT
CLOTH BEACON ORANGE TIMEOUT ST (SAFETY) ×2 IMPLANT
CORD ACTIVE DISPOSABLE (ELECTRODE) ×1
CORD ELECTRO ACTIVE DISP (ELECTRODE) ×1 IMPLANT
COVER BACK TABLE 60X90IN (DRAPES) ×2 IMPLANT
DRAPE LG THREE QUARTER DISP (DRAPES) ×2 IMPLANT
DRSG TELFA 3X8 NADH (GAUZE/BANDAGES/DRESSINGS) ×2 IMPLANT
ELECT LOOP GYNE PRO 24FR (CUTTING LOOP) ×2
ELECT REM PT RETURN 9FT ADLT (ELECTROSURGICAL) ×2
ELECT VAPORTRODE GRVD BAR (ELECTRODE) IMPLANT
ELECTRODE LOOP GYNE PRO 24FR (CUTTING LOOP) ×1 IMPLANT
ELECTRODE REM PT RTRN 9FT ADLT (ELECTROSURGICAL) ×1 IMPLANT
ELECTRODE ROLLER BARREL 22FR (ELECTROSURGICAL) IMPLANT
ELECTRODE VAPORCUT 22FR (ELECTROSURGICAL) IMPLANT
GLOVE BIO SURGEON STRL SZ 6.5 (GLOVE) ×2 IMPLANT
GLOVE BIOGEL PI IND STRL 7.0 (GLOVE) ×2 IMPLANT
GLOVE BIOGEL PI INDICATOR 7.0 (GLOVE) ×2
GLYCINE 1.5% IRRIG UROMATIC (IV SOLUTION) ×2 IMPLANT
GOWN STRL REUS W/ TWL LRG LVL3 (GOWN DISPOSABLE) ×1 IMPLANT
GOWN STRL REUS W/TWL LRG LVL3 (GOWN DISPOSABLE) ×1
KIT ROOM TURNOVER WOR (KITS) ×2 IMPLANT
LEGGING LITHOTOMY PAIR STRL (DRAPES) ×2 IMPLANT
LOOP ANGLED CUTTING 22FR (CUTTING LOOP) IMPLANT
MANIFOLD NEPTUNE II (INSTRUMENTS) IMPLANT
PACK BASIN DAY SURGERY FS (CUSTOM PROCEDURE TRAY) ×2 IMPLANT
PAD OB MATERNITY 4.3X12.25 (PERSONAL CARE ITEMS) ×2 IMPLANT
PAD PREP 24X48 CUFFED NSTRL (MISCELLANEOUS) ×2 IMPLANT
TOWEL OR 17X24 6PK STRL BLUE (TOWEL DISPOSABLE) ×4 IMPLANT
TRAY DSU PREP LF (CUSTOM PROCEDURE TRAY) ×2 IMPLANT
TUBE CONNECTING 12X1/4 (SUCTIONS) IMPLANT
WATER STERILE IRR 500ML POUR (IV SOLUTION) ×2 IMPLANT

## 2015-02-20 NOTE — Anesthesia Procedure Notes (Signed)
Procedure Name: LMA Insertion Date/Time: 02/20/2015 7:28 AM Performed by: Norva PavlovALLAWAY, Kawanda Drumheller G Pre-anesthesia Checklist: Patient identified, Emergency Drugs available, Suction available and Patient being monitored Patient Re-evaluated:Patient Re-evaluated prior to inductionOxygen Delivery Method: Circle System Utilized Preoxygenation: Pre-oxygenation with 100% oxygen Intubation Type: IV induction Ventilation: Mask ventilation without difficulty LMA: LMA inserted LMA Size: 4.0 Number of attempts: 1 Airway Equipment and Method: Bite block Placement Confirmation: positive ETCO2 Tube secured with: Tape Dental Injury: Teeth and Oropharynx as per pre-operative assessment

## 2015-02-20 NOTE — Transfer of Care (Signed)
Last Vitals:  Filed Vitals:   02/20/15 0609  BP: 124/62  Pulse: 74  Temp: 36.5 C  Resp: 16    Immediate Anesthesia Transfer of Care Note  Patient: Marvetta GibbonsDonna A Silsby  Procedure(s) Performed: Procedure(s) (LRB): DILATATION AND CURETTAGE /HYSTEROSCOPY (N/A)  Patient Location: PACU  Anesthesia Type: General  Level of Consciousness: awake, alert  and oriented  Airway & Oxygen Therapy: Patient Spontanous Breathing and Patient connected to nasal cannula oxygen  Post-op Assessment: Report given to PACU RN and Post -op Vital signs reviewed and stable  Post vital signs: Reviewed and stable  Complications: No apparent anesthesia complications

## 2015-02-20 NOTE — Anesthesia Postprocedure Evaluation (Signed)
Anesthesia Post Note  Patient: Barbara [name]  Procedure(s) Performed: Procedure(s) (LRB): DILATATION AND CURETTAGE /HYSTEROSCOPY (N/A)  Patient location during evaluation: PACU Anesthesia Type: General Level of consciousness: awake and alert Pain management: pain level controlled Vital Signs Assessment: post-procedure vital signs reviewed and stable Respiratory status: spontaneous breathing, nonlabored ventilation, respiratory function stable and patient connected to nasal cannula oxygen Cardiovascular status: blood pressure returned to baseline and stable Postop Assessment: no signs of nausea or vomiting Anesthetic complications: no    Last Vitals:  Filed Vitals:   02/20/15 0845 02/20/15 0855  BP: 105/60 105/58  Pulse: 71 61  Temp:    Resp: 15 13    Last Pain: There were no vitals filed for this visit.               Nash Bolls J

## 2015-02-20 NOTE — H&P (Signed)
Barbara GibbonsDonna A [name] is an 59 y.o. female with PMB presents for surgical evaluation.     Menstrual History: No LMP recorded. Patient is postmenopausal.    Past Medical History  Diagnosis Date  . 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 History of radiation therapy     10/ 2010--  02-21-2009--  right breast  . Complication of anesthesia     hard to wake  . PONV (postoperative nausea and vomiting)   . Wears contact lenses   . PMB (postmenopausal bleeding)     Past Surgical History  Procedure Laterality Date  . Dilatation & curettage/hysteroscopy with trueclear N/A 04/29/2013    Procedure: DILATATION & CURETTAGE/HYSTEROSCOPY WITH TRUCLEAR;  Surgeon: Zelphia CairoGretchen Damyia Strider, MD;  Location: [facility];  Service: Gynecology;  Laterality: N/A;  . Hysteroscopy w/d&c  aug 2012  &  april 2014  . Breast lumpectomy w/ needle localization Right 10-10-2008  . Re-excision of breast lumpectomy  sept 2010  . Dilatation & curettage/hysteroscopy with myosure N/A 05/16/2014    Procedure: DILATATION & CURETTAGE/HYSTEROSCOPY WITH MYOSURE;  Surgeon: Zelphia CairoGretchen Shine Mikes, MD;  [facility];  Service: Gynecology;  Laterality: N/A;    History reviewed. No pertinent family history.  Social History:  reports that she has never smoked. She has never used smokeless tobacco. She reports that she drinks alcohol. She reports that she does not use illicit drugs.  Allergies:  Allergies  Allergen Reactions  . 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 rate 16, height 5\' 4"  (1.626 m), weight 157 lb (71.215 kg), SpO2 99 %. Physical Exam  Gen  - NAD CV - RRR Lungs - clear Abd - NT Ext - NT  US - no masses or cysts.  No free fluid  Results for orders placed or performed during the hospital encounter of 02/20/15 (from the past 24 hour(s))  Hemoglobin-hemacue, POC     Status: None   Collection Time: 02/20/15  6:53 AM  Result Value Ref Range   Hemoglobin 14.1 12.0 - 15.0 g/dL    No results found.  Assessment/Plan: Hysteroscopy, D&C R/b/a discussed, questions answered, informed consent  Mischelle Reeg 02/20/2015, 7:19 AM

## 2015-02-20 NOTE — Discharge Instructions (Signed)
[facility] office 2-3 weeks for postop appointment.  Call the office [phone] for an appointment. ° °Personal Hygiene: °Use pads not tampons x 1week °You may shower, no tub baths or pools for 2-3 weeks °Wipe from front to back when using restroom ° °Activity: °Do not drive or operate any equipment for 24 hrs.   °Do not rest in bed all day °Walking is encouraged °Walk up and down stairs slowly °You may return to your normal activity in 1-2 days ° °Sexual Activity:  No intercourse for 2 weeks after the procedure. ° °Diet: Eat a light meal as desired this evening.  You may resume your usual diet tomorrow. ° °Return to work:  You may resume your work activities after 1-2 days ° °What to expect:  Expect to have vaginal bleeding/discharge for 2-3 days and spotting for 10-14 days.  It is not unusual to have soreness for 1-2 weeks.  You may have a slight burning sensation when you urinate for the first few days.  You may start your menses in 2-6 weeks.  Mild cramps may continue for a couple of days.   ° °Call your doctor:   °Excessive bleeding, saturating a pad every hour °Inability to urinate 6 hours after discharge °Pain not relieved with pain medications °Fever of 100.4 or greater ° ° °Post Anesthesia Home Care Instructions ° °Activity: °Get plenty of rest for the remainder of the day. A responsible adult should stay with you for 24 hours following the procedure.  °For the next 24 hours, DO NOT: °-Drive a car °-Operate machinery °-Drink alcoholic beverages °-Take any medication unless instructed by your physician °-Make any legal decisions or sign important papers. ° °Meals: °Start with liquid foods such as gelatin or soup. Progress to regular foods as tolerated. Avoid greasy, spicy, heavy foods. If nausea and/or vomiting occur, drink only clear liquids until the nausea and/or vomiting subsides. Call your physician if vomiting continues. ° °Special Instructions/Symptoms: °Your throat may feel dry or sore from the anesthesia or  the breathing tube placed in your throat during surgery. If this causes discomfort, gargle with warm salt water. The discomfort should disappear within 24 hours. ° °If you had a scopolamine patch placed behind your ear for the management of post- operative nausea and/or vomiting: ° °1. The medication in the patch is effective for 72 hours, after which it should be removed.  Wrap patch in a tissue and discard in the trash. Wash hands thoroughly with soap and water. °2. You may remove the patch earlier than 72 hours if you experience unpleasant side effects which may include dry mouth, dizziness or visual disturbances. °3. Avoid touching the patch. Wash your hands with soap and water after contact with the patch. °  ° °

## 2015-02-21 ENCOUNTER — Encounter (HOSPITAL_BASED_OUTPATIENT_CLINIC_OR_DEPARTMENT_OTHER): Payer: Self-pay | Admitting: Obstetrics and Gynecology

## 2015-02-21 NOTE — Op Note (Signed)
NAMCallie Fielding:  FREDY, Sharron                 ACCOUNT NO.:  [id number]  MEDICAL RECORD NO.:  [id number]  LOCATION:                               [facility]  PHYSICIAN:  Zelphia CairoGretchen Tavonna Worthington, MD    DATE OF BIRTH:  [birth year]  DATE OF PROCEDURE:  02/20/2015 DATE OF DISCHARGE:  02/20/2015                              OPERATIVE REPORT   PREOPERATIVE DIAGNOSIS:  Postmenopausal bleeding.  PROCEDURE: 1. Paracervical block. 2. Hysteroscopy, D and C.  SURGEON:  Zelphia CairoGretchen Almir Botts, MD  SPECIMEN:  Endometrial curettings.  COMPLICATIONS:  None.  CONDITION:  Stable to recovery room.  ANESTHESIA:  General.  PROCEDURE IN DETAIL:  The patient was taken to the operating room after general anesthesia was obtained.  She was placed in the dorsal lithotomy position using Allen stirrups.  Prepped and draped in sterile fashion. In-and-out catheter was used to drain the bladder for unmeasured amount of urine.  Bivalve speculum was placed in the vagina and a single-tooth tenaculum was placed anteriorly.  The hysteroscope was inserted after paracervical block was performed.  The hysteroscope was inserted.  Two small polypoid masses were noted.  No other abnormalities noted throughout the cavity.  Bilateral ostia were visualized and appeared normal.  Hysteroscope was removed and a gentle curetting was performed. Specimen was placed on Telfa and passed off to be sent to pathology. Hysteroscope was reinserted.  No other abnormalities were identified. Tenaculum was removed.  Cervix was hemostatic.  Speculum was removed. She was taken to the recovery room in stable condition.     Zelphia CairoGretchen Lauryn Lizardi, MD     GA/MEDQ  D:  02/20/2015  T:  02/20/2015  Job:  [id number]

## 2019-06-14 ENCOUNTER — Other Ambulatory Visit: Payer: Self-pay | Admitting: Obstetrics and Gynecology

## 2019-06-14 DIAGNOSIS — Z853 Personal history of malignant neoplasm of breast: Secondary | ICD-10-CM

## 2019-08-10 ENCOUNTER — Other Ambulatory Visit: Payer: BC Managed Care – PPO

## 2019-11-18 ENCOUNTER — Ambulatory Visit
Admission: RE | Admit: 2019-11-18 | Discharge: 2019-11-18 | Disposition: A | Discharge status: Home / Self Care | Payer: Self-pay | Source: Ambulatory Visit | Attending: Obstetrics and Gynecology | Admitting: Obstetrics and Gynecology

## 2019-11-18 ENCOUNTER — Other Ambulatory Visit: Payer: Self-pay | Admitting: Obstetrics and Gynecology

## 2019-11-18 ENCOUNTER — Inpatient Hospital Stay
Admission: RE | Admit: 2019-11-18 | Discharge: 2019-11-18 | Disposition: A | Discharge status: Home / Self Care | Payer: Self-pay | Source: Ambulatory Visit | Attending: Obstetrics and Gynecology | Admitting: Obstetrics and Gynecology

## 2019-11-18 ENCOUNTER — Ambulatory Visit
Admission: RE | Admit: 2019-11-18 | Discharge: 2019-11-18 | Disposition: A | Discharge status: Home / Self Care | Payer: BC Managed Care – PPO | Source: Ambulatory Visit | Attending: Obstetrics and Gynecology | Admitting: Obstetrics and Gynecology

## 2019-11-18 DIAGNOSIS — Z853 Personal history of malignant neoplasm of breast: Secondary | ICD-10-CM

## 2019-11-18 DIAGNOSIS — R52 Pain, unspecified: Secondary | ICD-10-CM

## 2019-11-18 MED ORDER — GADOBUTROL 1 MMOL/ML IV SOLN
7.0000 mL | Freq: Once | INTRAVENOUS | Status: AC | PRN
  Administered 2019-11-18: 7 mL via INTRAVENOUS
